# Patient Record
Sex: Female | Born: 1945 | Hispanic: Refuse to answer | Marital: Married | Smoking: Never smoker
Health system: Southern US, Community
[De-identification: ages and names within clinical notes are randomized; demographics above are authoritative.]

---

## 1998-10-29 ENCOUNTER — Other Ambulatory Visit: Admission: RE | Admit: 1998-10-29 | Discharge: 1998-10-29 | Payer: Self-pay | Admitting: *Deleted

## 2001-03-09 ENCOUNTER — Encounter: Admission: RE | Admit: 2001-03-09 | Discharge: 2001-03-09 | Payer: Self-pay | Admitting: Family Medicine

## 2001-03-09 ENCOUNTER — Encounter: Payer: Self-pay | Admitting: Family Medicine

## 2007-10-05 ENCOUNTER — Emergency Department: Payer: Self-pay | Admitting: Emergency Medicine

## 2007-10-05 ENCOUNTER — Other Ambulatory Visit: Payer: Self-pay

## 2007-10-12 ENCOUNTER — Emergency Department: Payer: Self-pay | Admitting: Emergency Medicine

## 2009-09-15 ENCOUNTER — Ambulatory Visit: Payer: Self-pay

## 2013-01-22 ENCOUNTER — Ambulatory Visit: Payer: Self-pay | Admitting: Obstetrics and Gynecology

## 2013-01-22 LAB — URINALYSIS, COMPLETE
Bilirubin,UR: NEGATIVE
Blood: NEGATIVE
Glucose,UR: NEGATIVE mg/dL (ref 0–75)
Ketone: NEGATIVE
Leukocyte Esterase: NEGATIVE
Nitrite: NEGATIVE
Ph: 8 (ref 4.5–8.0)
Protein: NEGATIVE
RBC,UR: <1 /HPF (ref 0–5)
Specific Gravity: 1.005 (ref 1.003–1.030)
Squamous Epithelial: 1
WBC UR: <1 /HPF (ref 0–5)

## 2013-01-22 LAB — HEMOGLOBIN: HGB: 12.4 g/dL (ref 12.0–16.0)

## 2013-01-25 ENCOUNTER — Ambulatory Visit: Payer: Self-pay | Admitting: Obstetrics and Gynecology

## 2013-01-28 LAB — PATHOLOGY REPORT

## 2013-01-29 ENCOUNTER — Ambulatory Visit: Payer: Self-pay | Admitting: Gynecologic Oncology

## 2013-07-30 ENCOUNTER — Ambulatory Visit: Payer: Self-pay | Admitting: Family Medicine

## 2014-08-01 ENCOUNTER — Ambulatory Visit: Payer: Self-pay | Admitting: Family Medicine

## 2014-08-29 NOTE — Op Note (Signed)
PATIENT NAME:  Pamela Fleming, Pamela Fleming MR#:  [id number] DATE OF BIRTH:  21-Oct-1945  DATE OF PROCEDURE:  01/25/2013  PREOPERATIVE DIAGNOSIS: Postmenopausal bleeding with suspicion for polyp.  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  PROCEDURE: Dilation and curettage, hysteroscopy.   SURGEON: Ricky L. Logan BoresEvans, M.D.   ANESTHESIA: General orotracheal.   FINDINGS: Hysteroscopic findings consistent with a fibroid from the left anterior uterus.   SPECIMENS: Endometrial curettings.   DRAINS: Red rubber catheter at the end of the case, about 125 mL.   PROCEDURE IN DETAIL: The patient was consented and taken to the operating room and placed in the supine position where anesthesia was initiated and placed in dorsal lithotomy position using candy-cane stirrups, prepped and draped in the usual sterile fashion. The cervix was visualized and grasped with a single-tooth tenaculum. It easily dilated up to a 17-French. The hysteroscope was inserted with findings as noted above.  Alternating sharp curette and polyp forceps removed an approximately 1 cm x 4 mm piece x 2 of fibroid along with generalized minimal endometrial curettings. The fibroid was not completely removed, but was felt that adequate sampling was obtained. At this point, the procedure was felt to have achieved maximum efficacy. Instruments were removed. The cervix was seen to be hemostatic. The bladder was drained, and the patient was returned to the supine position and left in care of anesthesia in stable condition.  The patient tolerated the procedure well. She will be given routine prescriptions, precautions and 2 week followup.  ____________________________ Reatha Harpsicky L. Logan BoresEvans, MD rle:sb D: 01/25/2013 08:01:52 ET T: 01/25/2013 08:09:03 ET JOB#: [id number]  cc: Ricky L. Logan BoresEvans, MD, <Dictator> Augustina MoodICK L Audreena Sachdeva MD ELECTRONICALLY SIGNED 01/26/2013 5:51

## 2016-11-14 ENCOUNTER — Other Ambulatory Visit: Payer: Self-pay | Admitting: Student

## 2016-11-14 DIAGNOSIS — M7542 Impingement syndrome of left shoulder: Secondary | ICD-10-CM

## 2016-11-14 DIAGNOSIS — M7582 Other shoulder lesions, left shoulder: Secondary | ICD-10-CM

## 2016-11-22 ENCOUNTER — Encounter: Payer: Self-pay | Admitting: Radiology

## 2016-11-22 ENCOUNTER — Ambulatory Visit
Admission: RE | Admit: 2016-11-22 | Discharge: 2016-11-22 | Disposition: A | Discharge status: Home / Self Care | Payer: Medicare Other | Source: Ambulatory Visit | Attending: Student | Admitting: Student

## 2016-11-22 DIAGNOSIS — M7582 Other shoulder lesions, left shoulder: Secondary | ICD-10-CM | POA: Diagnosis not present

## 2016-11-22 DIAGNOSIS — S46812A Strain of other muscles, fascia and tendons at shoulder and upper arm level, left arm, initial encounter: Secondary | ICD-10-CM | POA: Diagnosis not present

## 2016-11-22 DIAGNOSIS — M12812 Other specific arthropathies, not elsewhere classified, left shoulder: Secondary | ICD-10-CM | POA: Diagnosis not present

## 2016-11-22 DIAGNOSIS — X58XXXA Exposure to other specified factors, initial encounter: Secondary | ICD-10-CM | POA: Diagnosis not present

## 2016-11-22 DIAGNOSIS — M7542 Impingement syndrome of left shoulder: Secondary | ICD-10-CM

## 2017-01-25 ENCOUNTER — Ambulatory Visit: Admit: 2017-01-25 | Payer: Medicare Other | Admitting: Surgery

## 2017-01-25 SURGERY — ARTHROSCOPY, SHOULDER
Anesthesia: Choice | Laterality: Left

## 2017-05-21 ENCOUNTER — Encounter: Payer: Self-pay | Admitting: *Deleted

## 2017-05-21 NOTE — Congregational Nurse Program (Unsigned)
Congregational Nurse Program Note  Date of Encounter: 05/21/2017  Past Medical History: No past medical history on file.  Encounter Details: CNP Questionnaire - 05/21/17 1030      Questionnaire   Patient Status  Immigrant    Race  Asian    [facility]   Insurance  Medicare    Uninsured  Not Applicable    Food  No food insecurities    Housing/Utilities  Yes, have permanent housing    Transportation  No transportation needs    Interpersonal Safety  Yes, feel physically and emotionally safe where you currently live    Medication  No medication insecurities    Medical Provider  Yes    Referrals  Other Foot Dr   Foot Dr   ED Visit Averted  Not Applicable    Life-Saving Intervention Made  Not Applicable       C/o Lt Heel pain when she was walking. Referral Foot [place].

## 2019-03-26 ENCOUNTER — Other Ambulatory Visit: Payer: Self-pay

## 2019-03-26 ENCOUNTER — Ambulatory Visit: Payer: Self-pay

## 2019-03-26 ENCOUNTER — Ambulatory Visit (LOCAL_COMMUNITY_HEALTH_CENTER): Payer: Self-pay

## 2019-03-26 DIAGNOSIS — Z23 Encounter for immunization: Secondary | ICD-10-CM | POA: Diagnosis not present

## 2019-03-26 NOTE — Progress Notes (Signed)
Pt to clinic requesting pneumonia and influenza vaccines. Discussed Tetanus vaccines hx as well, pt states last booster was in 2010. Pt states she has never received any type of pneumonia vaccine; pt counseled that Prevnar 13 is recommended first then follow-up in one year for pneumovax 23. Administered Tdap, influenza, and Prevnar 13 vaccines today.

## 2019-05-04 ENCOUNTER — Other Ambulatory Visit: Payer: Self-pay

## 2019-05-04 ENCOUNTER — Emergency Department
Admission: EM | Admit: 2019-05-04 | Discharge: 2019-05-04 | Disposition: A | Discharge status: Home / Self Care | Payer: Medicare Other | Attending: Emergency Medicine | Admitting: Emergency Medicine

## 2019-05-04 ENCOUNTER — Encounter: Payer: Self-pay | Admitting: Emergency Medicine

## 2019-05-04 ENCOUNTER — Emergency Department: Payer: Medicare Other

## 2019-05-04 DIAGNOSIS — M791 Myalgia, unspecified site: Secondary | ICD-10-CM

## 2019-05-04 DIAGNOSIS — R0789 Other chest pain: Secondary | ICD-10-CM | POA: Diagnosis present

## 2019-05-04 DIAGNOSIS — M25512 Pain in left shoulder: Secondary | ICD-10-CM | POA: Insufficient documentation

## 2019-05-04 DIAGNOSIS — M542 Cervicalgia: Secondary | ICD-10-CM | POA: Diagnosis not present

## 2019-05-04 LAB — CBC
HCT: 35.2 % — ABNORMAL LOW (ref 36.0–46.0)
Hemoglobin: 12.3 g/dL (ref 12.0–15.0)
MCH: 32.3 pg (ref 26.0–34.0)
MCHC: 34.9 g/dL (ref 30.0–36.0)
MCV: 92.4 fL (ref 80.0–100.0)
Platelets: 348 10*3/uL (ref 150–400)
RBC: 3.81 MIL/uL — ABNORMAL LOW (ref 3.87–5.11)
RDW: 11.4 % — ABNORMAL LOW (ref 11.5–15.5)
WBC: 4.9 10*3/uL (ref 4.0–10.5)
nRBC: 0 % (ref 0.0–0.2)

## 2019-05-04 LAB — TROPONIN I (HIGH SENSITIVITY)
Troponin I (High Sensitivity): 3 ng/L (ref <18)
Troponin I (High Sensitivity): 3 ng/L (ref <18)

## 2019-05-04 LAB — BASIC METABOLIC PANEL
Anion gap: 12 (ref 5–15)
BUN: 11 mg/dL (ref 8–23)
CO2: 23 mmol/L (ref 22–32)
Calcium: 9.6 mg/dL (ref 8.9–10.3)
Chloride: 104 mmol/L (ref 98–111)
Creatinine, Ser: 0.56 mg/dL (ref 0.44–1.00)
GFR calc Af Amer: >60 mL/min (ref >60)
GFR calc non Af Amer: >60 mL/min (ref >60)
Glucose, Bld: 99 mg/dL (ref 70–99)
Potassium: 3.9 mmol/L (ref 3.5–5.1)
Sodium: 139 mmol/L (ref 135–145)

## 2019-05-04 MED ORDER — ACETAMINOPHEN 500 MG PO TABS
1000.0000 mg | ORAL_TABLET | Freq: Once | ORAL | Status: AC
  Administered 2019-05-04: 1000 mg via ORAL
  Filled 2019-05-04: qty 2

## 2019-05-04 MED ORDER — KETOROLAC TROMETHAMINE 30 MG/ML IJ SOLN
15.0000 mg | Freq: Once | INTRAMUSCULAR | Status: AC
  Administered 2019-05-04: 15 mg via INTRAVENOUS
  Filled 2019-05-04: qty 1

## 2019-05-04 MED ORDER — CYCLOBENZAPRINE HCL 5 MG PO TABS: 5.0000 mg | ORAL_TABLET | Freq: Every day | ORAL | 0 refills | Status: AC

## 2019-05-04 MED ORDER — FENTANYL CITRATE (PF) 100 MCG/2ML IJ SOLN
50.0000 ug | INTRAMUSCULAR | Status: DC | PRN
  Administered 2019-05-04: 50 ug via INTRAVENOUS
  Filled 2019-05-04: qty 2

## 2019-05-04 MED ORDER — LIDOCAINE 5 % EX PTCH
1.0000 | MEDICATED_PATCH | CUTANEOUS | Status: DC
  Administered 2019-05-04: 1 via TRANSDERMAL
  Filled 2019-05-04: qty 1

## 2019-05-04 MED ORDER — NAPROXEN 500 MG PO TABS: 500.0000 mg | ORAL_TABLET | Freq: Two times a day (BID) | ORAL | 0 refills | Status: AC

## 2019-05-04 MED ORDER — CYCLOBENZAPRINE HCL 10 MG PO TABS
5.0000 mg | ORAL_TABLET | Freq: Once | ORAL | Status: AC
  Administered 2019-05-04: 5 mg via ORAL
  Filled 2019-05-04: qty 1

## 2019-05-04 NOTE — ED Provider Notes (Signed)
[facility] Emergency Department Provider Note  ____________________________________________   First MD Initiated Contact with Patient 05/04/19 1749     (approximate)  I have reviewed the triage vital signs and the nursing notes.   HISTORY  Chief Complaint Chest Pain    HPI Pamela Fleming is a 73 y.o. female otherwise healthy who comes in with chest pain.  Patient states that she had her vaccines done on 11/17 and since then she has had some pain in her left shoulder.  The pain is not gotten any better and now start to go up into her shoulder.  The pain is moderate, worse with pushing on it or arm movements, nothing makes better, nothing makes it worse.  She did endorse a little bit of discomfort in her chest this morning which is why she came in to make sure that it was not her heart.  She denies any shortness of breath, cough, fevers or any other symptoms of pneumonia.  She denies any coronavirus contacts she denies any skin changes          History reviewed. No pertinent past medical history.  There are no problems to display for this patient.   History reviewed. No pertinent surgical history.  Prior to Admission medications   Not on File    Allergies Apple, Banana, Corn oil, Eggs or egg-derived products, Eggshell membrane (chicken)  [egg shells], Fish allergy, Mangifera indica, Peanut-containing drug products, and Prunus persica  History reviewed. No pertinent family history.  Social History Social History   Tobacco Use  . Smoking status: Never Smoker  . Smokeless tobacco: Never Used  Substance Use Topics  . Alcohol use: Not on file  . Drug use: Not on file      Review of Systems Constitutional: No fever/chills Eyes: No visual changes. ENT: No sore throat. Cardiovascular: Positive chest pain Respiratory: Denies shortness of breath. Gastrointestinal: No abdominal pain.  No nausea, no vomiting.  No diarrhea.  No  constipation. Genitourinary: Negative for dysuria. Musculoskeletal: Negative for back pain.  Arm pain Skin: Negative for rash. Neurological: Negative for headaches, focal weakness or numbness. All other ROS negative ____________________________________________   PHYSICAL EXAM:  VITAL SIGNS: ED Triage Vitals  Enc Vitals Group     BP 05/04/19 1451 114/75     Pulse Rate 05/04/19 1451 65     Resp 05/04/19 1451 18     Temp 05/04/19 1451 98.5 F (36.9 C)     Temp Source 05/04/19 1451 Oral     SpO2 05/04/19 1451 98 %     Weight 05/04/19 1452 140 lb (63.5 kg)     Height 05/04/19 1452 5\' 3"  (1.6 m)     Head Circumference --      Peak Flow --      Pain Score 05/04/19 1436 5     Pain Loc --      Pain Edu? --      Excl. in [place]? --     Constitutional: Alert and oriented. Well appearing and in no acute distress. Eyes: Conjunctivae are normal. EOMI. Head: Atraumatic. Nose: No congestion/rhinnorhea. Mouth/Throat: Mucous membranes are moist.   Neck: No stridor. Trachea Midline. FROM Cardiovascular: Normal rate, regular rhythm. Grossly normal heart sounds.  Good peripheral circulation. Respiratory: Normal respiratory effort.  No retractions. Lungs CTAB. Gastrointestinal: Soft and nontender. No distention. No abdominal bruits.  Musculoskeletal: No lower extremity tenderness nor edema.  No joint effusions.  Good distal pulse on the left arm.  No swelling of the arm.  Reproducible pain with palpation on the shoulder and into the back. Neurologic:  Normal speech and language. No gross focal neurologic deficits are appreciated.  Skin:  Skin is warm, dry and intact. No rash noted. Psychiatric: Mood and affect are normal. Speech and behavior are normal. GU: Deferred   ____________________________________________   LABS (all labs ordered are listed, but only abnormal results are displayed)  Labs Reviewed  CBC - Abnormal; Notable for the following components:      Result Value   RBC 3.81  (*)    HCT 35.2 (*)    RDW 11.4 (*)    All other components within normal limits  BASIC METABOLIC PANEL  TROPONIN I (HIGH SENSITIVITY)  TROPONIN I (HIGH SENSITIVITY)   ____________________________________________   ED ECG REPORT I, Concha SeMary E Isabellamarie Randa, the attending physician, personally viewed and interpreted this ECG.  EKG initially was rate of 70 but very difficult to interpret due to wavy baseline.  Was being read as an acute MI and did not agree with this interpretation.  Repeat EKG is normal sinus rate of 67, no ST elevation, T wave version in V2, normal intervals  Repeat EKG sinus rate 71, no ST elevation, T wave version in V2 with occasional PVC, normal intervals ____________________________________________  RADIOLOGY Vela ProseI, Mekai Wilkinson E Wash Nienhaus, personally viewed and evaluated these images (plain radiographs) as part of my medical decision making, as well as reviewing the written report by the radiologist.  ED MD interpretation: No acute pneumonia  Official radiology report(s): DG Chest 2 View  Result Date: 05/04/2019 CLINICAL DATA:  Chest pain, LEFT-sided pain from arm to back anti front of chest to neck and under arms, onset of symptoms following flu shot 11 17, shortness of breath when lying supine or on RIGHT side, fever, tingling in hands, former smoker EXAM: CHEST - 2 VIEW COMPARISON:  09/15/2009 FINDINGS: Normal heart size, mediastinal contours, and pulmonary vascularity. Patchy opacity in lingula adjacent to LEFT heart border most likely representing pneumonia. Emphysematous and bronchitic changes consistent with underlying COPD. No pleural effusion or pneumothorax. Bones unremarkable. IMPRESSION: COPD changes with new opacity in lingula adjacent to LEFT heart border likely representing pneumonia. Followup PA and lateral chest X-ray is recommended in 3-4 weeks following trial of antibiotic therapy to ensure resolution and exclude underlying abnormalities including malignancy.  Electronically Signed   By: Ulyses SouthwardMark  Boles M.D.   On: 05/04/2019 15:39    ____________________________________________   PROCEDURES  Procedure(s) performed (including Critical Care):  Procedures   ____________________________________________   INITIAL IMPRESSION / ASSESSMENT AND PLAN / ED COURSE   Pamela Fleming was evaluated in Emergency Department on 05/04/2019 for the symptoms described in the history of present illness. She was evaluated in the context of the global COVID-19 pandemic, which necessitated consideration that the patient might be at risk for infection with the SARS-CoV-2 virus that causes COVID-19. Institutional protocols and algorithms that pertain to the evaluation of patients at risk for COVID-19 are in a state of rapid change based on information released by regulatory bodies including the CDC and federal and state organizations. These policies and algorithms were followed during the patient's care in the ED.    Most Likely DDx:  -MSK (atypical chest pain) but will get cardiac markers to evaluate for ACS given risk factors/age   DDx that was also considered d/t potential to cause harm, but was found less likely based on history and physical (as detailed above): -PNA (no fevers,  cough but CXR to evaluate) -PNX (reassured with equal b/l breath sounds, CXR to evaluate) -Symptomatic anemia (will get H&H) -Pulmonary embolism as no sob at rest, not pleuritic in nature, no hypoxia -Aortic Dissection as no tearing pain and no radiation to the mid back, pulses equal -Pericarditis no rub on exam, EKG changes or hx to suggest dx -Tamponade (no notable SOB, tachycardic, hypotensive) -Esophageal rupture (no h/o diffuse vomitting/no crepitus)   Cardiac markers negative x2.  No evidence of anemia.  Concern for possible pneumonia on the x-ray.  Discussed with patient and she had [place] no symptoms of pneumonia.  We elected to hold off on antibiotics and she will follow-up for  repeat chest x-ray to evaluate for resolution.  Patient's pain is very reproducible on exam.  There is no skin changes to suggest cellulitis.  Good distal pulse so unlikely arterial occlusion.  No swelling to suggest DVT.  I discussed continued symptomatic treatment with naproxen, Tylenol, Flexeril at nighttime and to follow-up with her primary care doctor in 1 week if her symptoms are not getting better.    I discussed the provisional nature of ED diagnosis, the treatment so far, the ongoing plan of care, follow up appointments and return precautions with the patient and any family or support people present. They expressed understanding and agreed with the plan, discharged home. ____________________________________________   FINAL CLINICAL IMPRESSION(S) / ED DIAGNOSES   Final diagnoses:  Muscle pain     MEDICATIONS GIVEN DURING THIS VISIT:  Medications  fentaNYL (SUBLIMAZE) injection 50 mcg (50 mcg Intravenous Given 05/04/19 1708)  cyclobenzaprine (FLEXERIL) tablet 5 mg (has no administration in time range)  acetaminophen (TYLENOL) tablet 1,000 mg (has no administration in time range)  lidocaine (LIDODERM) 5 % 1 patch (has no administration in time range)  ketorolac (TORADOL) 30 MG/ML injection 15 mg (15 mg Intravenous Given 05/04/19 1817)     ED Discharge Orders         Ordered    naproxen (NAPROSYN) 500 MG tablet  2 times daily with meals     05/04/19 1810    cyclobenzaprine (FLEXERIL) 5 MG tablet  Daily at bedtime     05/04/19 1810           Note:  This document was prepared using Dragon voice recognition software and may include unintentional dictation errors.   Concha Se, MD 05/04/19 [phone]

## 2019-05-04 NOTE — ED Notes (Signed)
Initial EKG read STEMI on printout, Per Dr.paduchowski, no stemi, repeat EKG, completed 2 other EKGs as after the 2nd EKG the monitor was showing PVCs so I was able to capture PVCs on the 3rd EKG.

## 2019-05-04 NOTE — Discharge Instructions (Signed)
Take 1 g of Tylenol every 8 hours combined with the naproxen.  Do not take aspirin with this or other medicines like ibuprofen or Motrin.  Take the Flexeril at night to help with muscle aches and to help with sleep.  Follow-up with your primary care doctor in 1 week if your symptoms are not resolving.  Return to the ER if you have worsening chest pain or shortness of breath or any other concerns  Your x-ray was concerning for pneumonia but you had no symptoms of it.  We elected to hold off on antibiotics.  However you to follow-up for repeat x-ray in 3 to 4 weeks  COPD changes with new opacity in lingula adjacent to LEFT heart border likely representing pneumonia.   Followup PA and lateral chest X-ray is recommended in 3-4 weeks following trial of antibiotic therapy to ensure resolution and exclude underlying abnormalities including malignancy.

## 2019-05-04 NOTE — ED Notes (Signed)
First Nurse Note: Pt to ED via POV, pt states that [facility] Pt states that she has been having arm pain and not feeling well since getting the injections. Pt is in NAD.

## 2019-05-04 NOTE — ED Triage Notes (Addendum)
Pt arrived via POV with reports of chest pain while driving from visiting daughter in [place] today, pt states she has been having left side neck pain and left shoulder pain. Pt also c/o pain between shoulder blades. No distress noted at this time.  Pt tried to massage the area on her back but continues to have pain.

## 2019-09-04 ENCOUNTER — Other Ambulatory Visit: Payer: Self-pay | Admitting: Family Medicine

## 2019-09-04 DIAGNOSIS — Z1231 Encounter for screening mammogram for malignant neoplasm of breast: Secondary | ICD-10-CM

## 2019-09-06 ENCOUNTER — Ambulatory Visit
Admission: RE | Admit: 2019-09-06 | Discharge: 2019-09-06 | Disposition: A | Discharge status: Home / Self Care | Payer: Medicare Other | Source: Ambulatory Visit | Attending: Family Medicine | Admitting: Family Medicine

## 2019-09-06 DIAGNOSIS — Z1231 Encounter for screening mammogram for malignant neoplasm of breast: Secondary | ICD-10-CM | POA: Insufficient documentation

## 2021-07-19 IMAGING — CR DG CHEST 2V
1 series · 2 of 2 positions shown · non-contrast
Comparison: 09/15/2009

CLINICAL DATA: Chest pain, LEFT-sided pain from arm to back anti
front of chest to neck and under arms, onset of symptoms following
flu shot 11 17, shortness of breath when lying supine or on RIGHT
side, fever, tingling in hands, former smoker

EXAM:
CHEST - 2 VIEW

[Series 1: dg chest 2 view · 0.14mm/px · 2 of 2 slices shown]
[im 1/2]
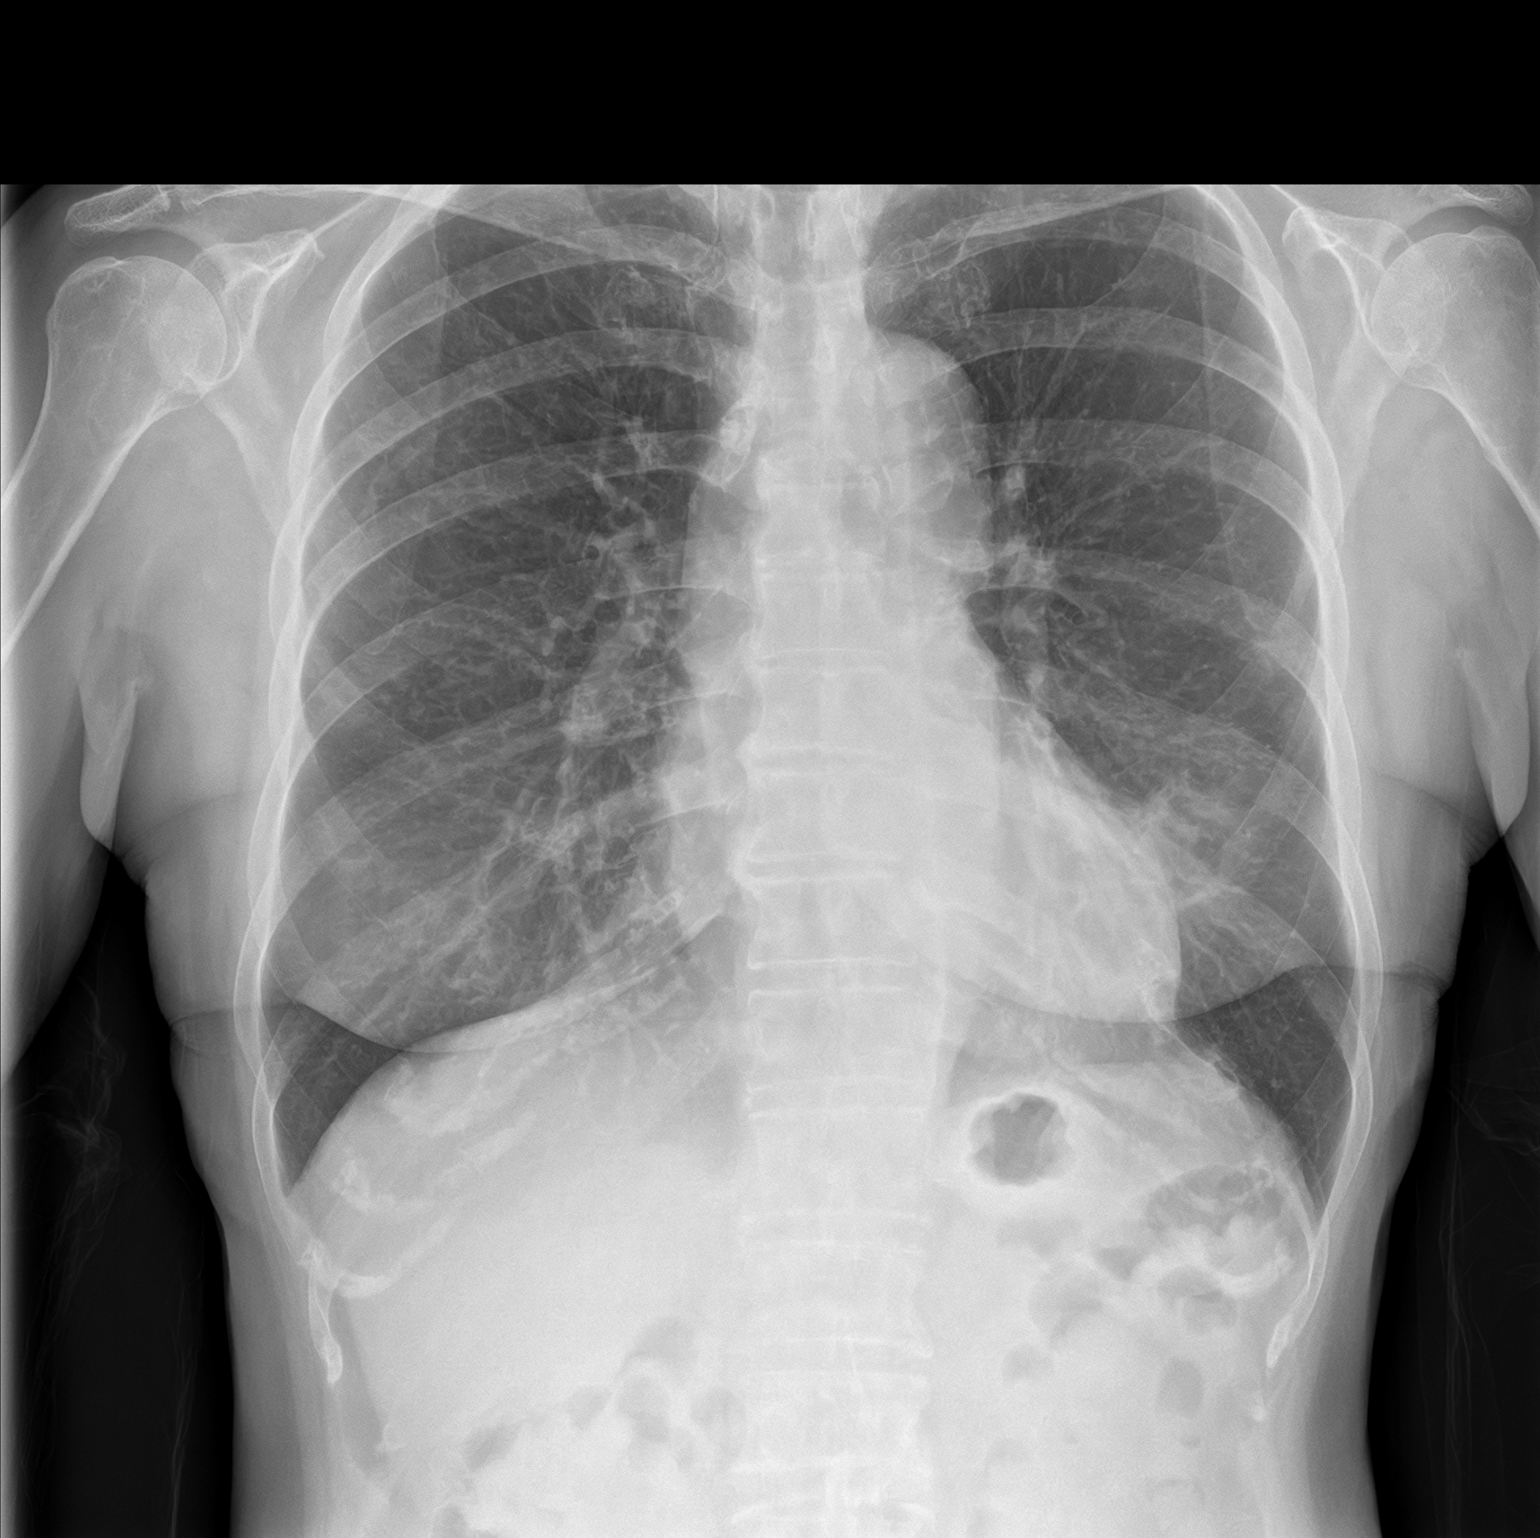
[im 2/2]
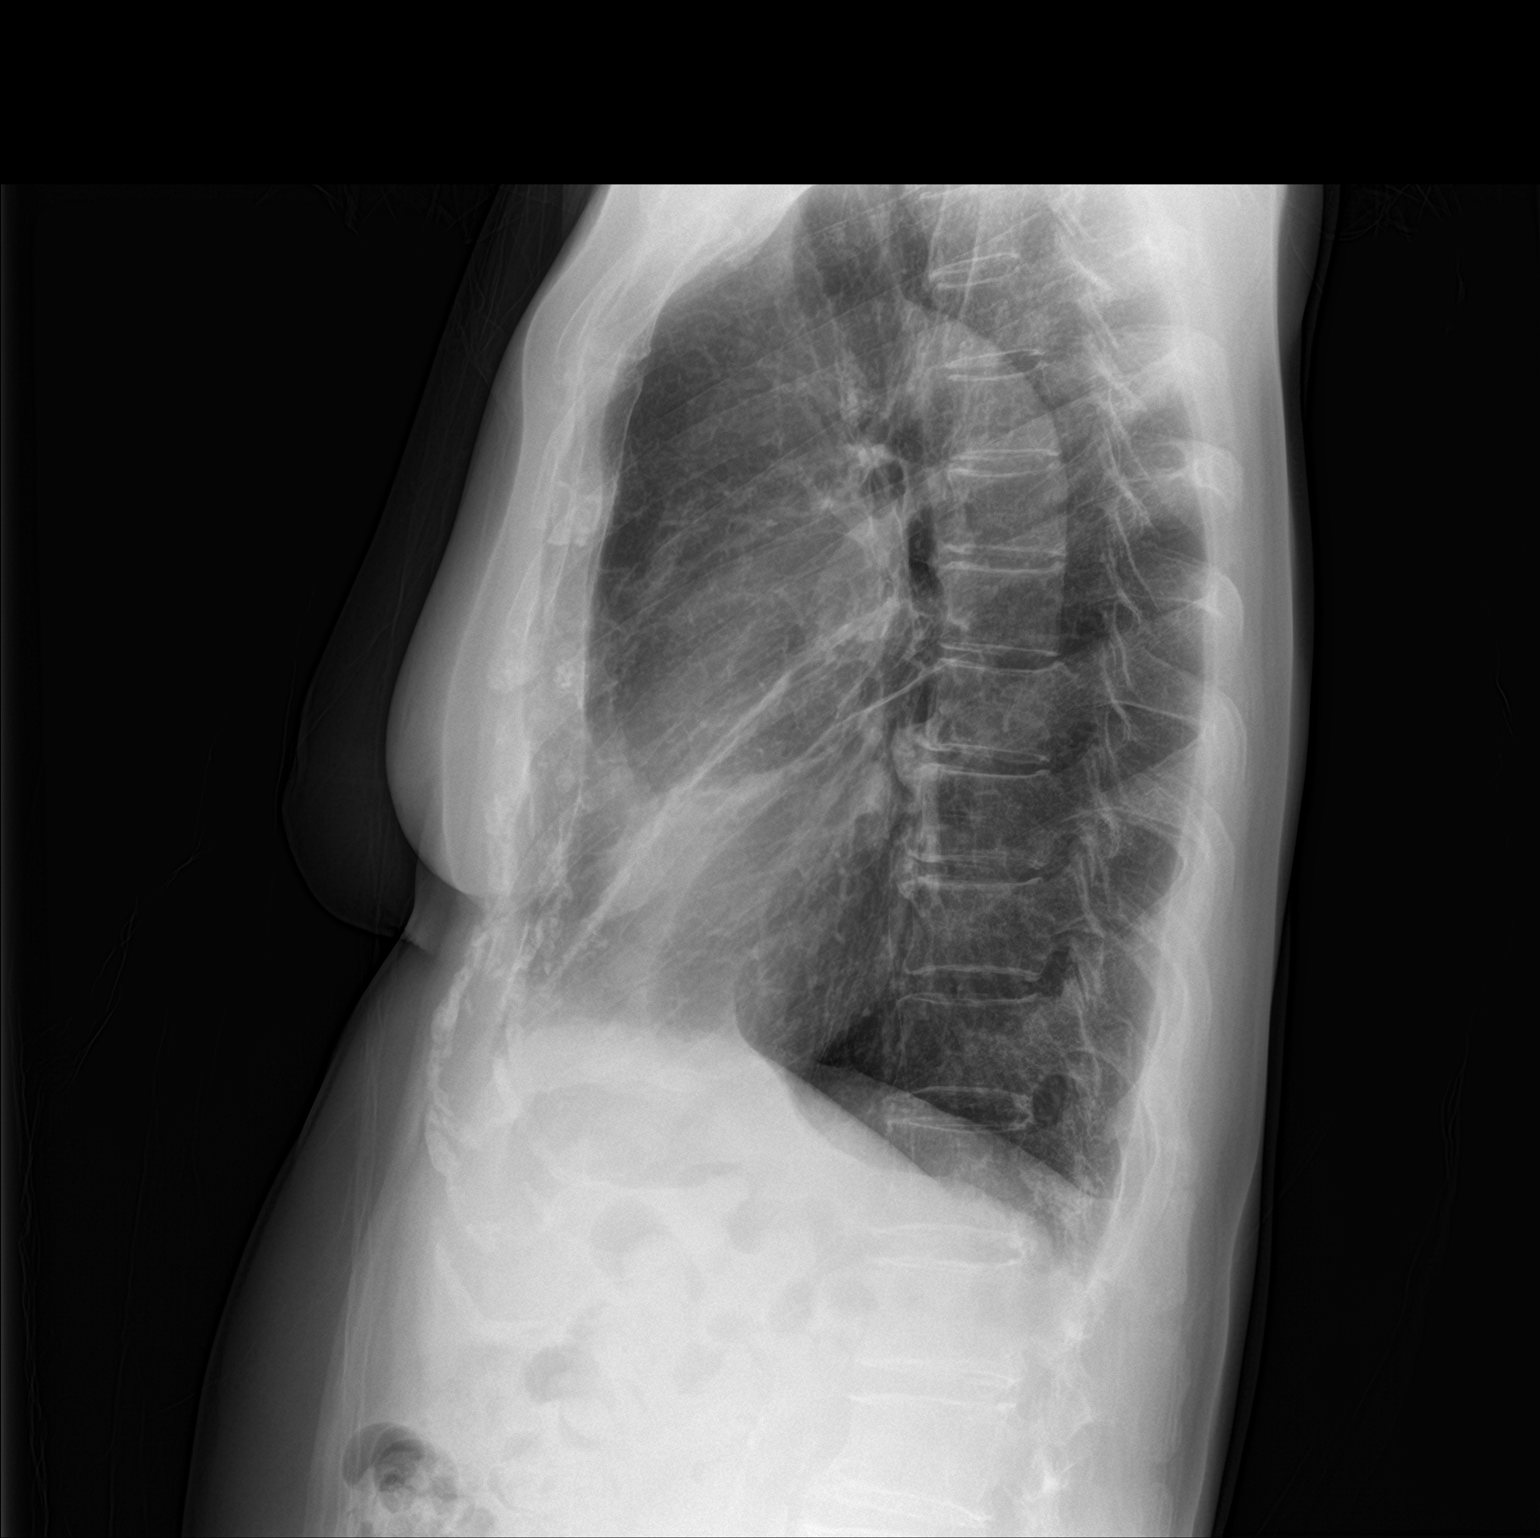

[2 of 2 positions shown; findings below may reference images not displayed]

FINDINGS: Normal heart size, mediastinal contours, and pulmonary vascularity.

Patchy opacity in lingula adjacent to LEFT heart border most likely
representing pneumonia.

Emphysematous and bronchitic changes consistent with underlying
COPD.

No pleural effusion or pneumothorax.

Bones unremarkable.
IMPRESSION: COPD changes with new opacity in lingula adjacent to LEFT heart
border likely representing pneumonia.

Followup PA and lateral chest X-ray is recommended in 3-4 weeks
following trial of antibiotic therapy to ensure resolution and
exclude underlying abnormalities including malignancy.

## 2021-10-19 ENCOUNTER — Other Ambulatory Visit: Payer: Self-pay | Admitting: Family Medicine

## 2021-10-19 DIAGNOSIS — Z1231 Encounter for screening mammogram for malignant neoplasm of breast: Secondary | ICD-10-CM

## 2021-11-12 ENCOUNTER — Other Ambulatory Visit: Payer: Self-pay | Admitting: Family Medicine

## 2021-11-12 DIAGNOSIS — Z8542 Personal history of malignant neoplasm of other parts of uterus: Secondary | ICD-10-CM

## 2021-11-23 ENCOUNTER — Ambulatory Visit
Admission: RE | Admit: 2021-11-23 | Discharge: 2021-11-23 | Disposition: A | Discharge status: Home / Self Care | Payer: Medicare Other | Source: Ambulatory Visit | Attending: Family Medicine | Admitting: Family Medicine

## 2021-11-23 DIAGNOSIS — Z8542 Personal history of malignant neoplasm of other parts of uterus: Secondary | ICD-10-CM | POA: Insufficient documentation

## 2021-11-23 MED ORDER — IOHEXOL 300 MG/ML  SOLN
100.0000 mL | Freq: Once | INTRAMUSCULAR | Status: AC | PRN
  Administered 2021-11-23: 100 mL via INTRAVENOUS

## 2021-11-30 ENCOUNTER — Other Ambulatory Visit: Payer: Self-pay | Admitting: Family Medicine

## 2021-11-30 DIAGNOSIS — R9389 Abnormal findings on diagnostic imaging of other specified body structures: Secondary | ICD-10-CM

## 2021-12-03 ENCOUNTER — Ambulatory Visit: Payer: Medicare Other

## 2023-03-20 ENCOUNTER — Other Ambulatory Visit
Admission: RE | Admit: 2023-03-20 | Discharge: 2023-03-20 | Disposition: A | Discharge status: Home / Self Care | Payer: Medicare Other | Source: Ambulatory Visit | Attending: Student | Admitting: Student

## 2023-03-20 DIAGNOSIS — R042 Hemoptysis: Secondary | ICD-10-CM | POA: Insufficient documentation

## 2023-03-20 LAB — D-DIMER, QUANTITATIVE: D-Dimer, Quant: 0.27 ug{FEU}/mL (ref 0.00–0.50)

## 2023-03-24 ENCOUNTER — Other Ambulatory Visit: Payer: Self-pay | Admitting: Student

## 2023-03-24 DIAGNOSIS — R042 Hemoptysis: Secondary | ICD-10-CM

## 2023-03-24 DIAGNOSIS — R9389 Abnormal findings on diagnostic imaging of other specified body structures: Secondary | ICD-10-CM

## 2023-03-30 ENCOUNTER — Ambulatory Visit
Admission: RE | Admit: 2023-03-30 | Discharge: 2023-03-30 | Disposition: A | Discharge status: Home / Self Care | Payer: Medicare Other | Source: Ambulatory Visit | Attending: Student | Admitting: Student

## 2023-03-30 DIAGNOSIS — R042 Hemoptysis: Secondary | ICD-10-CM | POA: Diagnosis present

## 2023-03-30 DIAGNOSIS — R9389 Abnormal findings on diagnostic imaging of other specified body structures: Secondary | ICD-10-CM | POA: Insufficient documentation
